# Patient Record
Sex: Female | Born: 1995 | Hispanic: No | Marital: Single
Health system: Southern US, Community
[De-identification: ages and names within clinical notes are randomized; demographics above are authoritative.]

---

## 2018-01-13 ENCOUNTER — Other Ambulatory Visit: Payer: Self-pay

## 2018-01-13 ENCOUNTER — Emergency Department (HOSPITAL_COMMUNITY)
Admission: EM | Admit: 2018-01-13 | Discharge: 2018-01-14 | Disposition: A | Discharge status: Home / Self Care | Attending: Emergency Medicine | Admitting: Emergency Medicine

## 2018-01-13 DIAGNOSIS — R1084 Generalized abdominal pain: Secondary | ICD-10-CM | POA: Diagnosis not present

## 2018-01-13 DIAGNOSIS — Z79899 Other long term (current) drug therapy: Secondary | ICD-10-CM | POA: Insufficient documentation

## 2018-01-13 DIAGNOSIS — R112 Nausea with vomiting, unspecified: Secondary | ICD-10-CM | POA: Diagnosis present

## 2018-01-13 LAB — COMPREHENSIVE METABOLIC PANEL
ALT: 13 U/L (ref 0–44)
AST: 23 U/L (ref 15–41)
Albumin: 4 g/dL (ref 3.5–5.0)
Alkaline Phosphatase: 48 U/L (ref 38–126)
Anion gap: 8 (ref 5–15)
BILIRUBIN TOTAL: 0.4 mg/dL (ref 0.3–1.2)
BUN: 11 mg/dL (ref 6–20)
CO2: 26 mmol/L (ref 22–32)
Calcium: 9.3 mg/dL (ref 8.9–10.3)
Chloride: 102 mmol/L (ref 98–111)
Creatinine, Ser: 0.94 mg/dL (ref 0.44–1.00)
GFR calc Af Amer: >60 mL/min (ref >60)
GLUCOSE: 102 mg/dL — AB (ref 70–99)
Potassium: 3.4 mmol/L — ABNORMAL LOW (ref 3.5–5.1)
Sodium: 136 mmol/L (ref 135–145)
TOTAL PROTEIN: 7.7 g/dL (ref 6.5–8.1)

## 2018-01-13 LAB — URINALYSIS, ROUTINE W REFLEX MICROSCOPIC
Bilirubin Urine: NEGATIVE
Glucose, UA: NEGATIVE mg/dL
Ketones, ur: NEGATIVE mg/dL
Leukocytes, UA: NEGATIVE
NITRITE: NEGATIVE
PROTEIN: NEGATIVE mg/dL
Specific Gravity, Urine: 1.009 (ref 1.005–1.030)
pH: 6 (ref 5.0–8.0)

## 2018-01-13 LAB — CBC
HEMATOCRIT: 38.5 % (ref 36.0–46.0)
Hemoglobin: 12.1 g/dL (ref 12.0–15.0)
MCH: 27.1 pg (ref 26.0–34.0)
MCHC: 31.4 g/dL (ref 30.0–36.0)
MCV: 86.3 fL (ref 80.0–100.0)
Platelets: 292 10*3/uL (ref 150–400)
RBC: 4.46 MIL/uL (ref 3.87–5.11)
RDW: 12.7 % (ref 11.5–15.5)
WBC: 9.4 10*3/uL (ref 4.0–10.5)
nRBC: 0 % (ref 0.0–0.2)

## 2018-01-13 LAB — LIPASE, BLOOD: Lipase: 35 U/L (ref 11–51)

## 2018-01-13 LAB — I-STAT BETA HCG BLOOD, ED (MC, WL, AP ONLY)

## 2018-01-13 NOTE — ED Triage Notes (Signed)
Patient c/o intermittent N/V with constipation. Was dx with [facility].

## 2018-01-14 ENCOUNTER — Emergency Department (HOSPITAL_COMMUNITY)

## 2018-01-14 MED ORDER — SODIUM CHLORIDE 0.9 % IV BOLUS
1000.0000 mL | Freq: Once | INTRAVENOUS | Status: AC
  Administered 2018-01-14: 1000 mL via INTRAVENOUS

## 2018-01-14 MED ORDER — METOCLOPRAMIDE HCL 10 MG PO TABS: 10.0000 mg | ORAL_TABLET | Freq: Four times a day (QID) | ORAL | 0 refills | Status: AC | PRN

## 2018-01-14 MED ORDER — IOHEXOL 300 MG/ML  SOLN
100.0000 mL | Freq: Once | INTRAMUSCULAR | Status: AC | PRN
  Administered 2018-01-14: 100 mL via INTRAVENOUS

## 2018-01-14 MED ORDER — RANITIDINE HCL 150 MG PO TABS: 150.0000 mg | ORAL_TABLET | Freq: Two times a day (BID) | ORAL | 0 refills | Status: DC

## 2018-01-14 MED ORDER — ONDANSETRON HCL 4 MG/2ML IJ SOLN
4.0000 mg | Freq: Once | INTRAMUSCULAR | Status: AC
  Administered 2018-01-14: 4 mg via INTRAVENOUS
  Filled 2018-01-14: qty 2

## 2018-01-14 MED ORDER — RANITIDINE HCL 150 MG PO TABS: 150.0000 mg | ORAL_TABLET | Freq: Two times a day (BID) | ORAL | 0 refills | Status: AC

## 2018-01-14 NOTE — ED Notes (Signed)
Pt in CT at this time.

## 2018-01-14 NOTE — ED Provider Notes (Signed)
[facility] EMERGENCY DEPARTMENT Provider Note   CSN: [id number] Arrival date & time: 01/13/18  2231     History   Chief Complaint Chief Complaint  Patient presents with  . N/V    HPI Brooke Melton is a 22 y.o. female with a hx of no major medical problems presents to the Emergency Department complaining of intermittent nausea and vomiting onset 1 week ago.  She reports symptoms resolved approximately 3 days ago and returned this evening.  She states last weekend she had 2 episodes of loose stool without melena or hematochezia.  She reports throughout the week she had 1-2 episodes of vomiting and all of her other episodes were "dry heaving."  Patient reports she is been able to eat and drink without difficulty.  She reports tonight she did vomit a small amount in her mouth but was able to swallow it.  [facility] and given Zofran.  She reports this medicine helps temporarily but did not abate her symptoms.  No other treatments prior to arrival.  She denies specific aggravating or alleviating factors.  She reports she does well with a bland diet but anytime she eats something substantial she feels nauseated.  Denies focal right upper quadrant abdominal pain.  The history is provided by the patient, medical records and a significant other. No language interpreter was used.    No past medical history on file.  There are no active problems to display for this patient.     OB History   None      Home Medications    Prior to Admission medications   Medication Sig Start Date End Date Taking? Authorizing Provider  PRESCRIPTION MEDICATION Take 1 tablet by mouth daily. Birth control   Yes [provider]  metoCLOPramide (REGLAN) 10 MG tablet Take 1 tablet (10 mg total) by mouth every 6 (six) hours as needed for nausea or vomiting (nausea/headache). 01/14/18   Taseen Marasigan, Dahlia Client, PA-C  ranitidine (ZANTAC) 150 MG tablet Take 1  tablet (150 mg total) by mouth 2 (two) times daily. 01/14/18   Shannelle Alguire, Dahlia Client, PA-C    Family History No family history on file.  Social History Social History   Tobacco Use  . Smoking status: Not on file  Substance Use Topics  . Alcohol use: Not on file  . Drug use: Not on file     Allergies   Patient has no known allergies.   Review of Systems Review of Systems  Constitutional: Negative for appetite change, diaphoresis, fatigue, fever and unexpected weight change.  HENT: Negative for mouth sores.   Eyes: Negative for visual disturbance.  Respiratory: Negative for cough, chest tightness, shortness of breath and wheezing.   Cardiovascular: Negative for chest pain.  Gastrointestinal: Positive for nausea and vomiting. Negative for abdominal pain, constipation and diarrhea.  Endocrine: Negative for polydipsia, polyphagia and polyuria.  Genitourinary: Negative for dysuria, frequency, hematuria and urgency.  Musculoskeletal: Negative for back pain and neck stiffness.  Skin: Negative for rash.  Allergic/Immunologic: Negative for immunocompromised state.  Neurological: Negative for syncope, light-headedness and headaches.  Hematological: Does not bruise/bleed easily.  Psychiatric/Behavioral: Negative for sleep disturbance. The patient is not nervous/anxious.      Physical Exam Updated Vital Signs BP 120/87 (BP Location: Right Arm)   Pulse 70   Temp 98.7 F (37.1 C) (Oral)   Resp 16   Ht 5\' 8"  (1.727 m)   Wt 63.5 kg   LMP 01/09/2018 (Exact  Date)   SpO2 98%   BMI 21.29 kg/m   Physical Exam  Constitutional: She appears well-developed and well-nourished. No distress.  Awake, alert, nontoxic appearance  HENT:  Head: Normocephalic and atraumatic.  Mouth/Throat: Oropharynx is clear and moist. No oropharyngeal exudate.  Eyes: Conjunctivae are normal. No scleral icterus.  Neck: Normal range of motion. Neck supple.  Cardiovascular: Normal rate, regular rhythm and  intact distal pulses.  Pulmonary/Chest: Effort normal and breath sounds normal. No respiratory distress. She has no wheezes.  Equal chest expansion  Abdominal: Soft. Bowel sounds are normal. She exhibits no mass. There is generalized tenderness ( Minimally tender). There is no rigidity, no rebound, no guarding and no CVA tenderness.  Musculoskeletal: Normal range of motion. She exhibits no edema.  Neurological: She is alert.  Speech is clear and goal oriented Moves extremities without ataxia  Skin: Skin is warm and dry. She is not diaphoretic.  Psychiatric: She has a normal mood and affect.  Nursing note and vitals reviewed.    ED Treatments / Results  Labs (all labs ordered are listed, but only abnormal results are displayed) Labs Reviewed  COMPREHENSIVE METABOLIC PANEL - Abnormal; Notable for the following components:      Result Value   Potassium 3.4 (*)    Glucose, Bld 102 (*)    All other components within normal limits  URINALYSIS, ROUTINE W REFLEX MICROSCOPIC - Abnormal; Notable for the following components:   Color, Urine STRAW (*)    Hgb urine dipstick MODERATE (*)    Bacteria, UA RARE (*)    All other components within normal limits  LIPASE, BLOOD  CBC  I-STAT BETA HCG BLOOD, ED ([facility], [facility], [facility] ONLY)     Radiology Ct Abdomen Pelvis W Contrast  Result Date: 01/14/2018 CLINICAL DATA:  Vomiting and abdominal pain for 1 week EXAM: CT ABDOMEN AND PELVIS WITH CONTRAST TECHNIQUE: Multidetector CT imaging of the abdomen and pelvis was performed using the standard protocol following bolus administration of intravenous contrast. CONTRAST:  OMNIPAQUE IOHEXOL 300 MG/ML  SOLN COMPARISON:  None. FINDINGS: LOWER CHEST: There is no basilar pleural or apical pericardial effusion. HEPATOBILIARY: The hepatic contours and density are normal. There is no intra- or extrahepatic biliary dilatation. The gallbladder is normal. PANCREAS: The pancreatic parenchymal contours are normal and there  is no ductal dilatation. There is no peripancreatic fluid collection. SPLEEN: Normal. ADRENALS/URINARY TRACT: --Adrenal glands: Normal. --Right kidney/ureter: No hydronephrosis, nephroureterolithiasis, perinephric stranding or solid renal mass. --Left kidney/ureter: No hydronephrosis, nephroureterolithiasis, perinephric stranding or solid renal mass. --Urinary bladder: Normal for degree of distention STOMACH/BOWEL: --Stomach/Duodenum: There is no hiatal hernia or other gastric abnormality. The duodenal course and caliber are normal. --Small bowel: No dilatation or inflammation. --Colon: No focal abnormality. --Appendix: Normal. VASCULAR/LYMPHATIC: Normal course and caliber of the major abdominal vessels. No abdominal or pelvic lymphadenopathy. REPRODUCTIVE: Normal uterus and ovaries. MUSCULOSKELETAL. No bony spinal canal stenosis or focal osseous abnormality. OTHER: None. IMPRESSION: No acute abnormality of the abdomen or pelvis. Electronically Signed   By: Deatra Robinson M.D.   On: 01/14/2018 04:30    Procedures Procedures (including critical care time)  Medications Ordered in ED Medications  sodium chloride 0.9 % bolus 1,000 mL (1,000 mLs Intravenous New Bag/Given 01/14/18 0353)  ondansetron (ZOFRAN) injection 4 mg (4 mg Intravenous Given 01/14/18 0353)  iohexol (OMNIPAQUE) 300 MG/ML solution 100 mL (100 mLs Intravenous Contrast Given 01/14/18 0406)     Initial Impression / Assessment and Plan / ED Course  I have reviewed the triage vital signs and the nursing notes.  Pertinent labs & imaging results that were available during my care of the patient were reviewed by me and considered in my medical decision making (see chart for details).  Clinical Course as of Jan 14 525  [facility] Jan 14, 2018  0518 Very mild.  Likely secondary to vomiting.  Discussed foods rich in potassium.  Potassium(!): 3.4 [HM]  0519 Patient is menstruating.  Hgb urine dipstickMarland Kitchen): MODERATE [HM]  0519 Vital signs are without  evidence of sepsis.  Temp: 98.2 F (36.8 C) [HM]    Clinical Course User Index [HM] Hayde Kilgour, Dahlia Client, PA-C    Patient is nontoxic, nonseptic appearing, in no apparent distress.  Patient's symptoms adequately managed in emergency department.  Fluid bolus given.  Labs and vitals reviewed.  On exam, patient with mild, generalized tenderness without rebound or guarding.  Patient reports she is very frustrated by her persistent symptoms.  Discussed risk and benefit of CT scan.  As she has a benign abdomen I do not believe that this test will reveal an emergent condition.  Patient does wish to have CT scan.  Patient does not meet the SIRS or Sepsis criteria.  On repeat exam patient does not have a surgical abdomin and there are no peritoneal signs.  Abdomen is soft and nontender.  CT scan is without acute abnormalities.  I personally evaluated these images.  No indication of appendicitis, bowel obstruction, bowel perforation, cholecystitis, diverticulitis, PID or ectopic pregnancy.  Patient discharged home with symptomatic treatment and given strict instructions for follow-up with their primary care physician.  I have also discussed reasons to return immediately to the ER.  Patient expresses understanding and agrees with plan.   Final Clinical Impressions(s) / ED Diagnoses   Final diagnoses:  Generalized abdominal pain    ED Discharge Orders         Ordered    metoCLOPramide (REGLAN) 10 MG tablet  Every 6 hours PRN     01/14/18 0447    ranitidine (ZANTAC) 150 MG tablet  2 times daily,   Status:  Discontinued     01/14/18 0448    ranitidine (ZANTAC) 150 MG tablet  2 times daily     01/14/18 0449           Macoy Rodwell, Dahlia Client, PA-C 01/14/18 0527    Ward, Layla Maw, DO 01/14/18 [phone]

## 2018-01-14 NOTE — Discharge Instructions (Addendum)
1. Medications: Reglan, zantac, usual home medications 2. Treatment: rest, drink plenty of fluids, advance diet slowly 3. Follow Up: Please followup with your primary doctor in 2 days for discussion of your diagnoses and further evaluation after today's visit; if you do not have a primary care doctor use the resource guide provided to find one; Please return to the ER for persistent vomiting, high fevers or worsening symptoms

## 2019-09-26 IMAGING — CT CT ABD-PELV W/ CM
2 of 4 series · 16 of 46 positions shown, 18 images · IV contrast (APPLIED)
Comparison: None.

CLINICAL DATA: Vomiting and abdominal pain for 1 week

EXAM:
CT ABDOMEN AND PELVIS WITH CONTRAST
TECHNIQUE: Multidetector CT imaging of the abdomen and pelvis was performed
using the standard protocol following bolus administration of
intravenous contrast.
CONTRAST:  100mL OMNIPAQUE IOHEXOL 300 MG/ML  SOLN

[Series 3: abdomen 5.0 · axial · 0.64mm/px · z∈[-443,-78]mm · 13 of 87 slices shown, 15 images]
[im 7/87  soft-tissue]
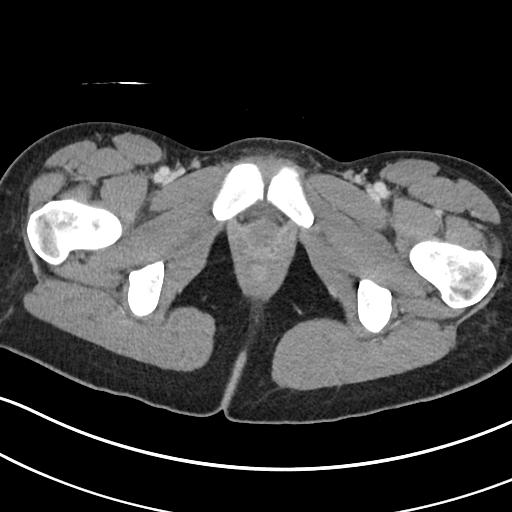
[im 7/87  bone]
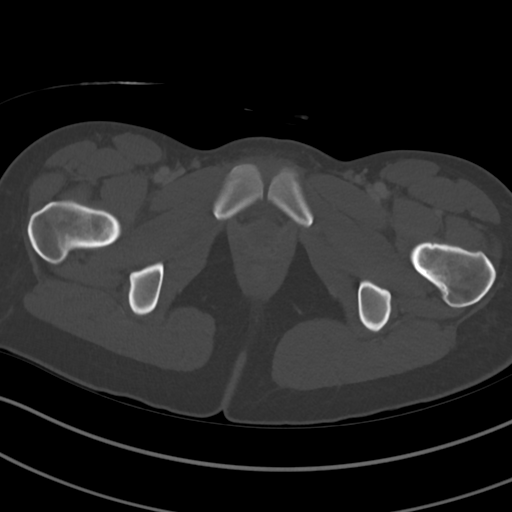
[im 13/87  soft-tissue]
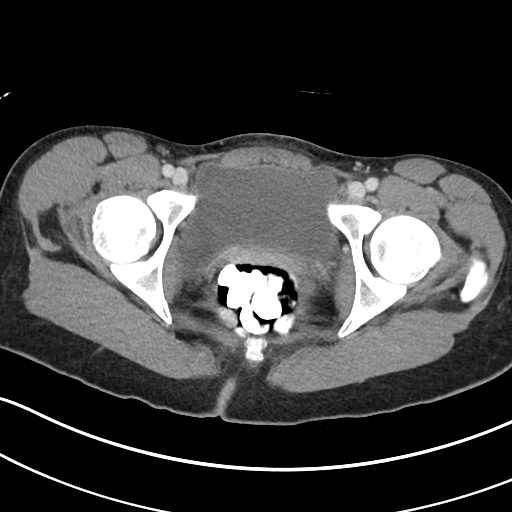
[im 19/87  soft-tissue]
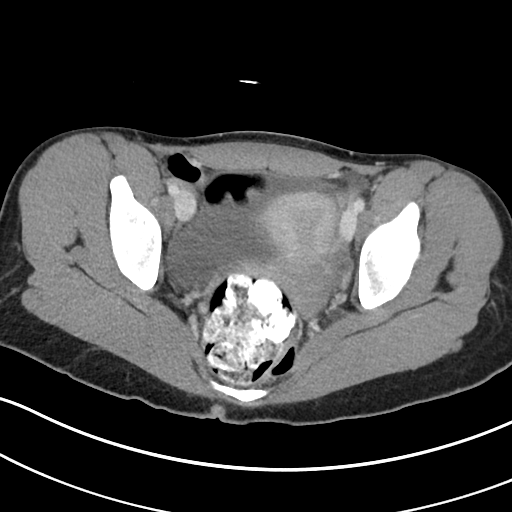
[im 25/87  soft-tissue]
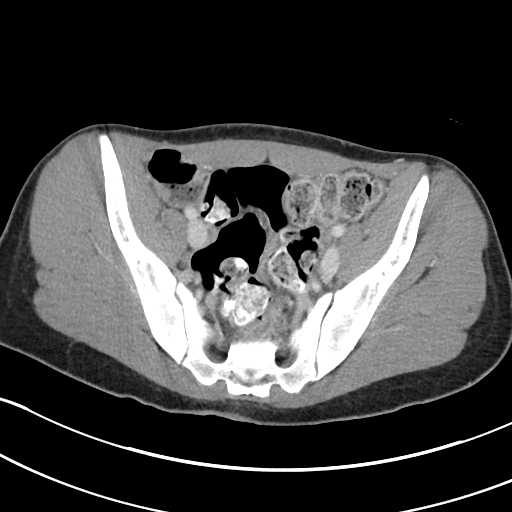
[im 31/87  soft-tissue]
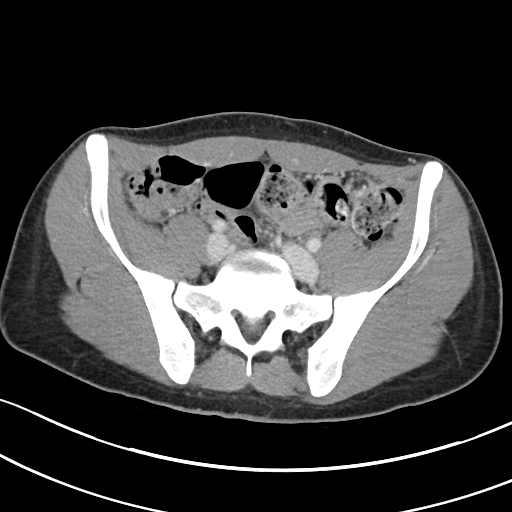
[im 37/87  soft-tissue]
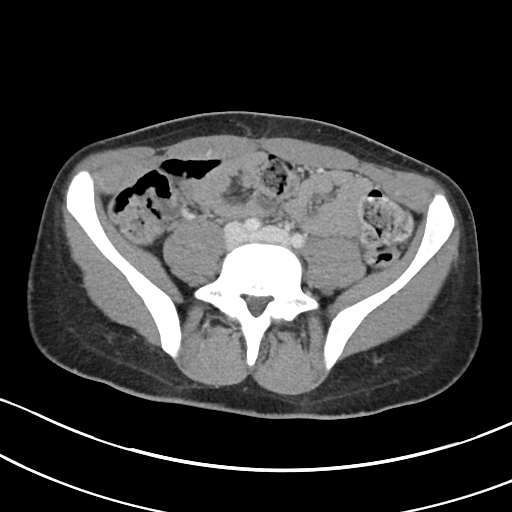
[im 44/87  soft-tissue]
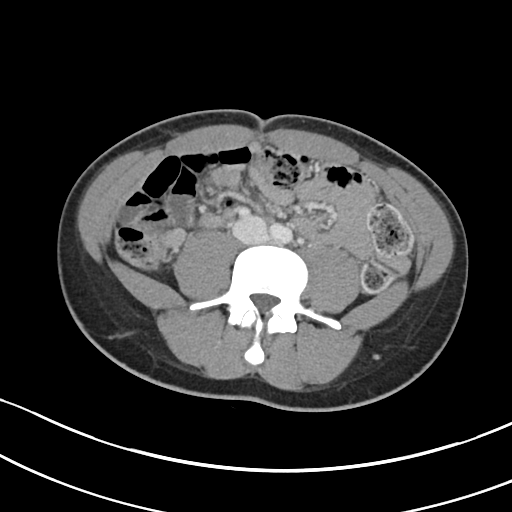
[im 50/87  soft-tissue]
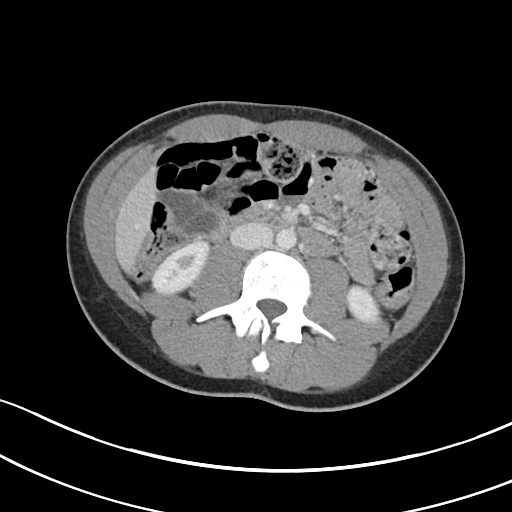
[im 56/87  soft-tissue]
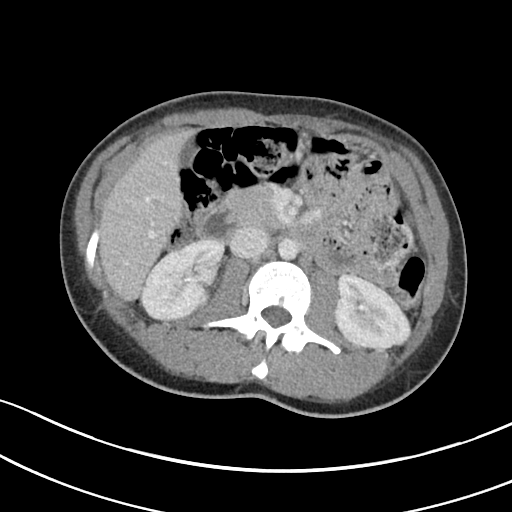
[im 56/87  bone]
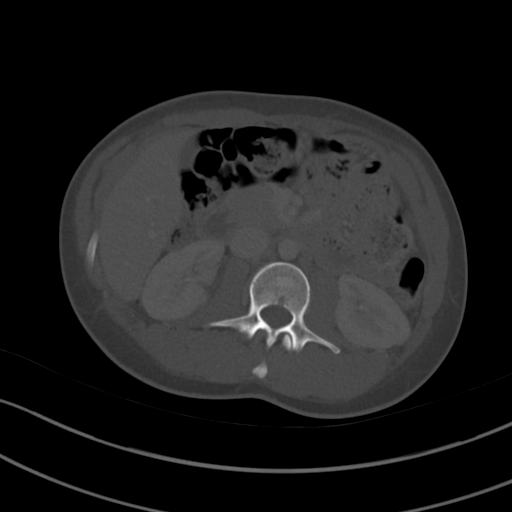
[im 62/87  soft-tissue]
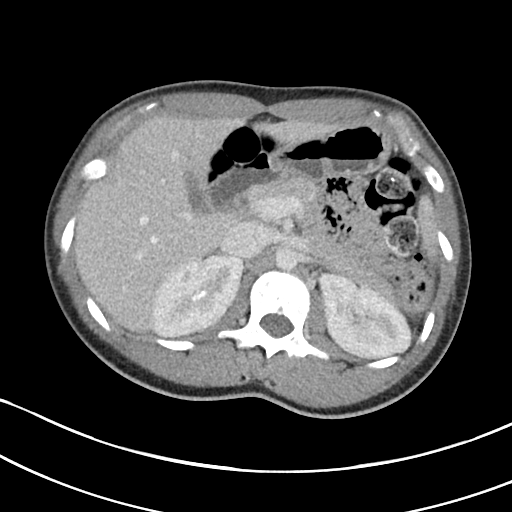
[im 68/87  soft-tissue]
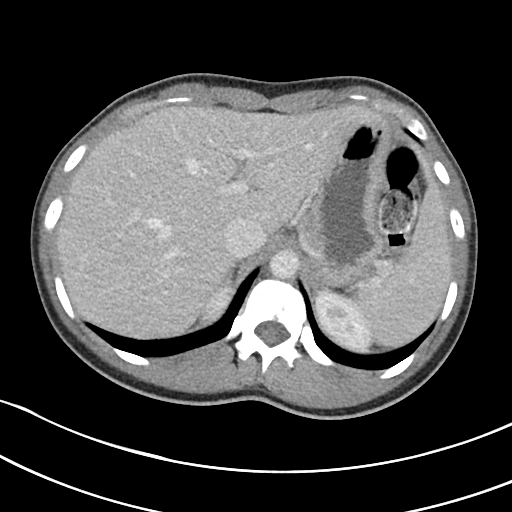
[im 74/87  soft-tissue]
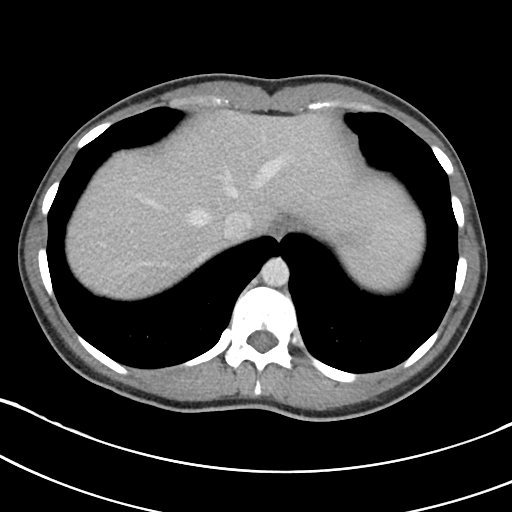
[im 80/87  soft-tissue]
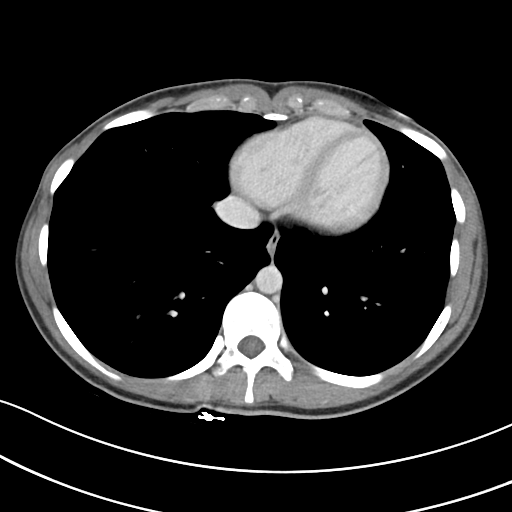

[Series 6: abdomen 3.0 mpr cor · coronal · 0.61mm/px · 3 of 71 slices shown]
[im 24/71  soft-tissue]
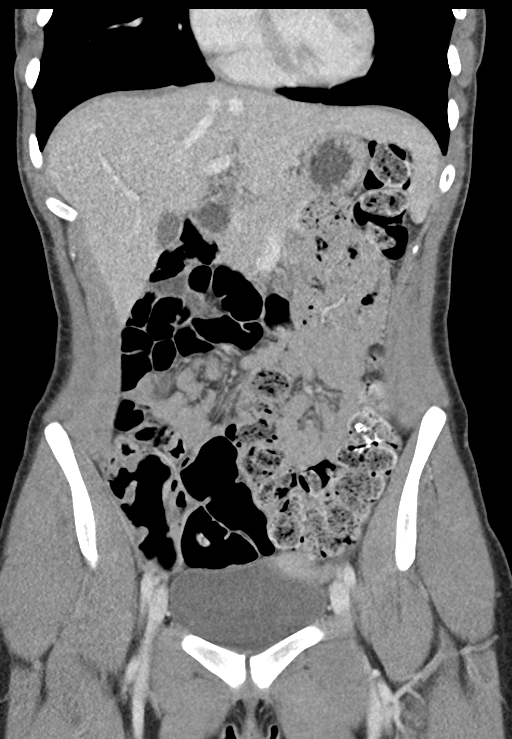
[im 32/71  soft-tissue]
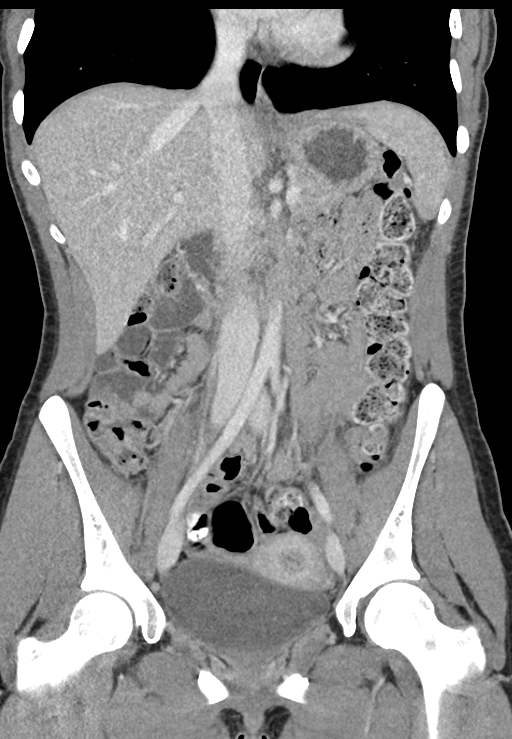
[im 39/71  soft-tissue]
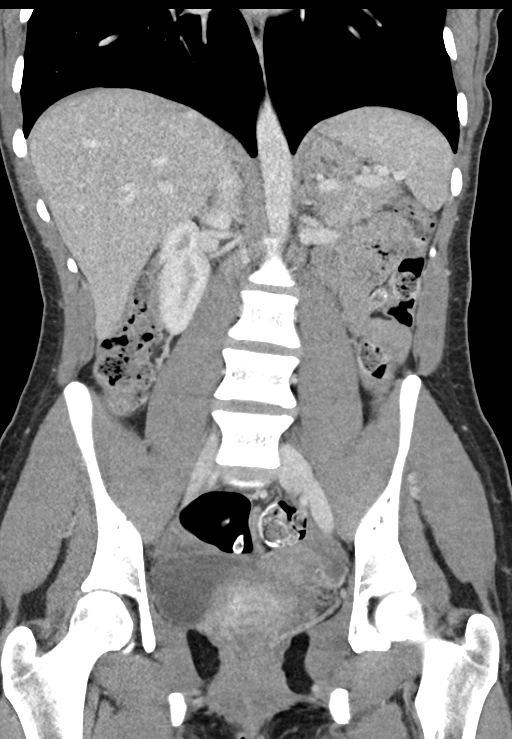

[16 of 46 positions shown; findings below may reference images not displayed]

FINDINGS: LOWER CHEST: There is no basilar pleural or apical pericardial
effusion.

HEPATOBILIARY: The hepatic contours and density are normal. There is
no intra- or extrahepatic biliary dilatation. The gallbladder is
normal.

PANCREAS: The pancreatic parenchymal contours are normal and there
is no ductal dilatation. There is no peripancreatic fluid
collection.

SPLEEN: Normal.

ADRENALS/URINARY TRACT:

--Adrenal glands: Normal.

--Right kidney/ureter: No hydronephrosis, nephroureterolithiasis,
perinephric stranding or solid renal mass.

--Left kidney/ureter: No hydronephrosis, nephroureterolithiasis,
perinephric stranding or solid renal mass.

--Urinary bladder: Normal for degree of distention

STOMACH/BOWEL:

--Stomach/Duodenum: There is no hiatal hernia or other gastric
abnormality. The duodenal course and caliber are normal.

--Small bowel: No dilatation or inflammation.

--Colon: No focal abnormality.

--Appendix: Normal.

VASCULAR/LYMPHATIC: Normal course and caliber of the major abdominal
vessels. No abdominal or pelvic lymphadenopathy.

REPRODUCTIVE: Normal uterus and ovaries.

MUSCULOSKELETAL. No bony spinal canal stenosis or focal osseous
abnormality.

OTHER: None.
IMPRESSION: No acute abnormality of the abdomen or pelvis.
